# Patient Record
Sex: Male | Born: 1970 | Race: White | Hispanic: No | Marital: Single | Smoking: Former smoker
Health system: Southern US, Community
[De-identification: ages and names within clinical notes are randomized; demographics above are authoritative.]

## PROBLEM LIST (undated history)

## (undated) DIAGNOSIS — E785 Hyperlipidemia, unspecified: Secondary | ICD-10-CM

## (undated) DIAGNOSIS — M1990 Unspecified osteoarthritis, unspecified site: Secondary | ICD-10-CM

## (undated) DIAGNOSIS — K219 Gastro-esophageal reflux disease without esophagitis: Secondary | ICD-10-CM

## (undated) HISTORY — PX: APPENDECTOMY: SHX54

## (undated) HISTORY — PX: MULTIPLE TOOTH EXTRACTIONS: SHX2053

## (undated) HISTORY — PX: VASECTOMY: SHX75

---

## 2006-03-31 ENCOUNTER — Emergency Department: Payer: Self-pay | Admitting: Emergency Medicine

## 2008-04-20 IMAGING — CR DG ANKLE COMPLETE 3+V*L*
1 series · 5 of 5 positions shown · non-contrast
Comparison: none

REASON FOR EXAM: fall  [facility]
COMMENTS:

PROCEDURE:     DXR - DXR ANKLE LEFT COMPLETE  - March 31, 2006  [DATE]
RESULT:     Multiple views reveal no acute fractures.  There appears old
chip fractures from the medial malleolus.  Soft tissue swelling over the
lateral malleolus is noted.

[Series 1: view not recorded · 0.17mm/px · 5 of 5 slices shown]
[im 1/5]
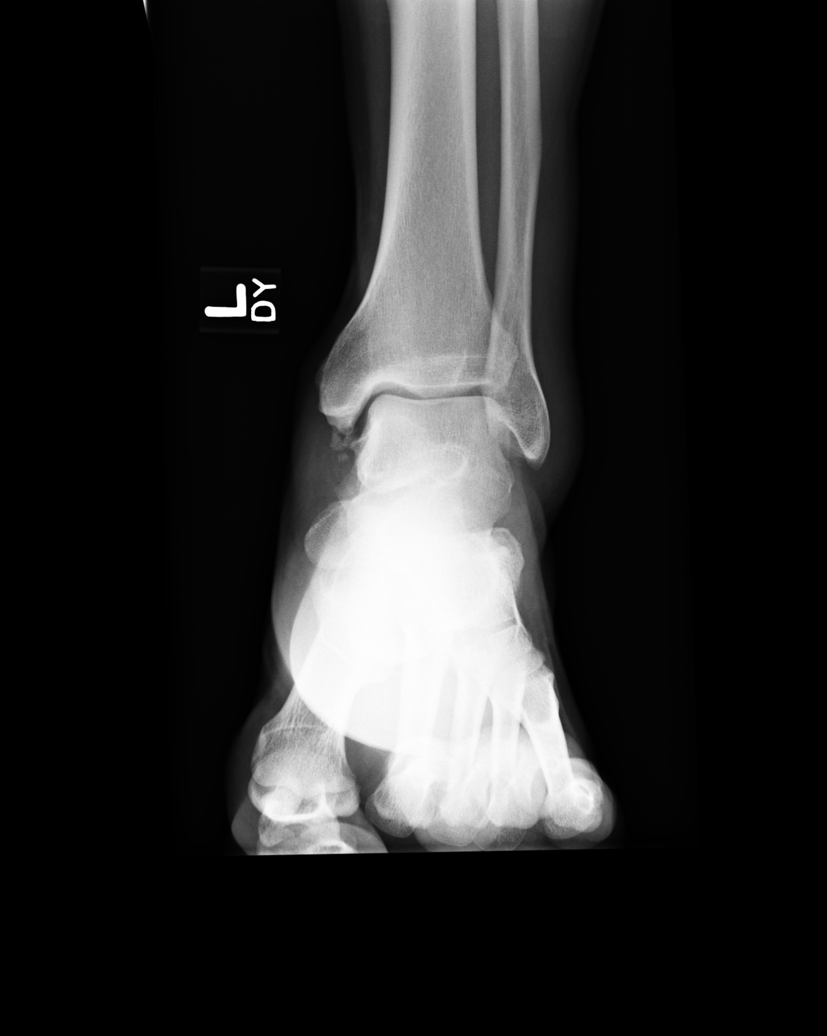
[im 2/5]
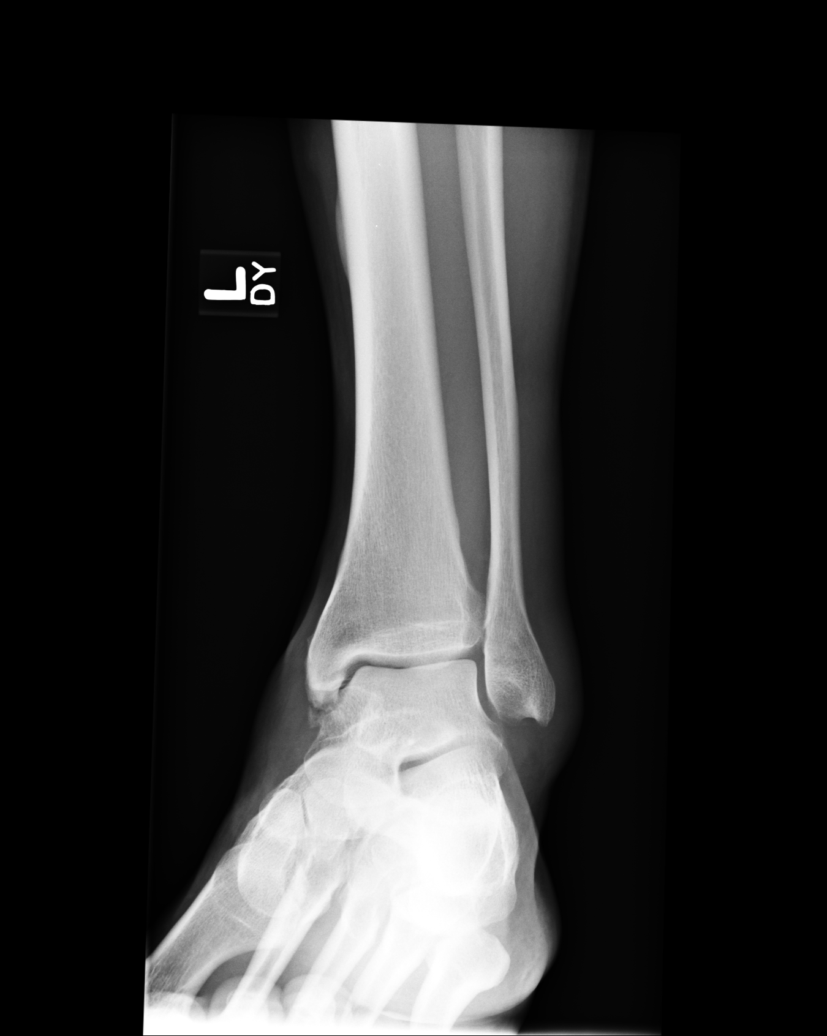
[im 3/5]
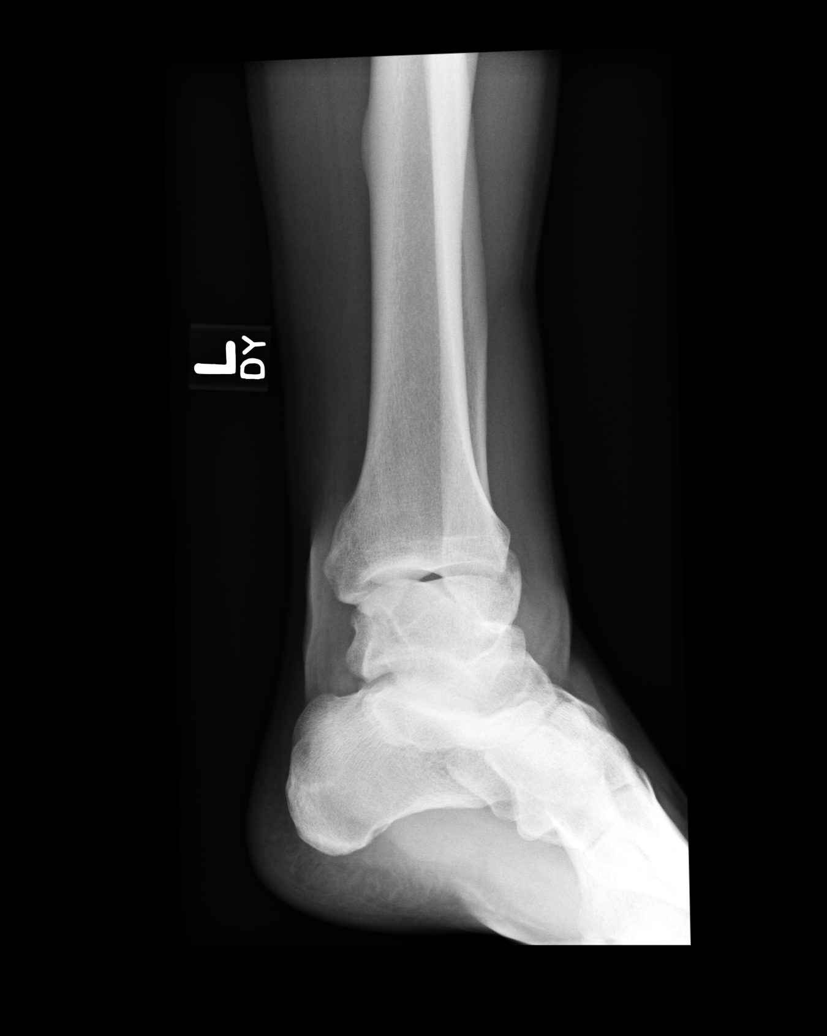
[im 4/5]
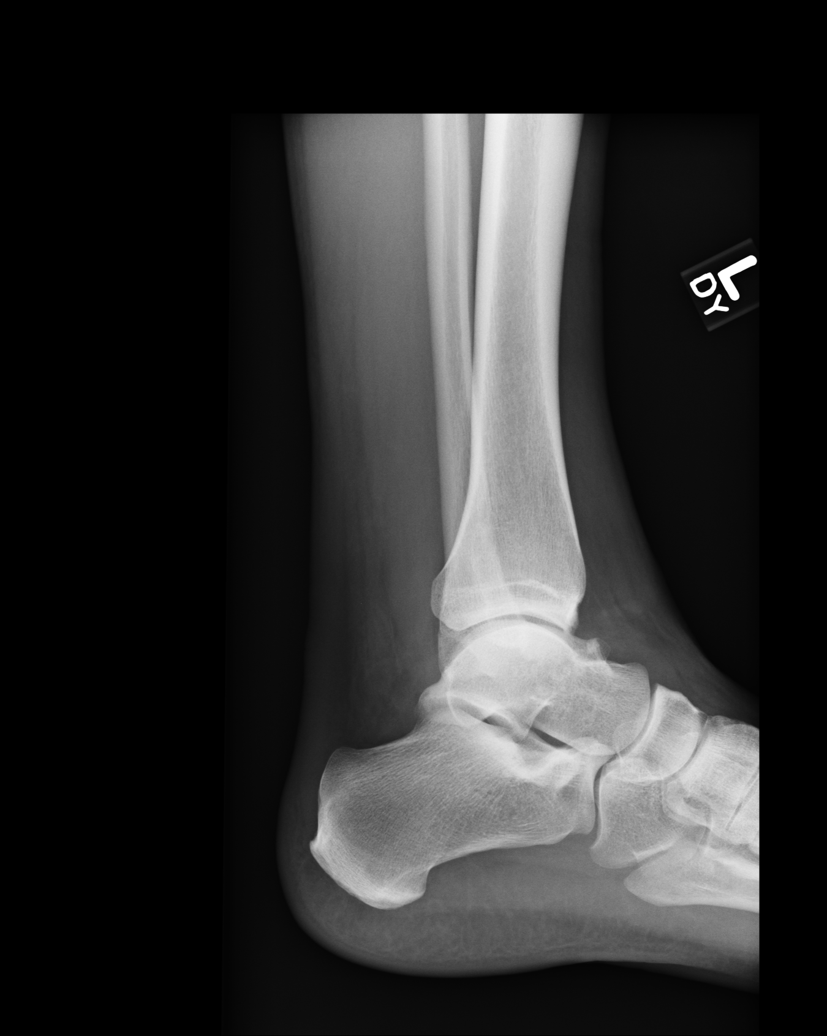
[im 5/5]
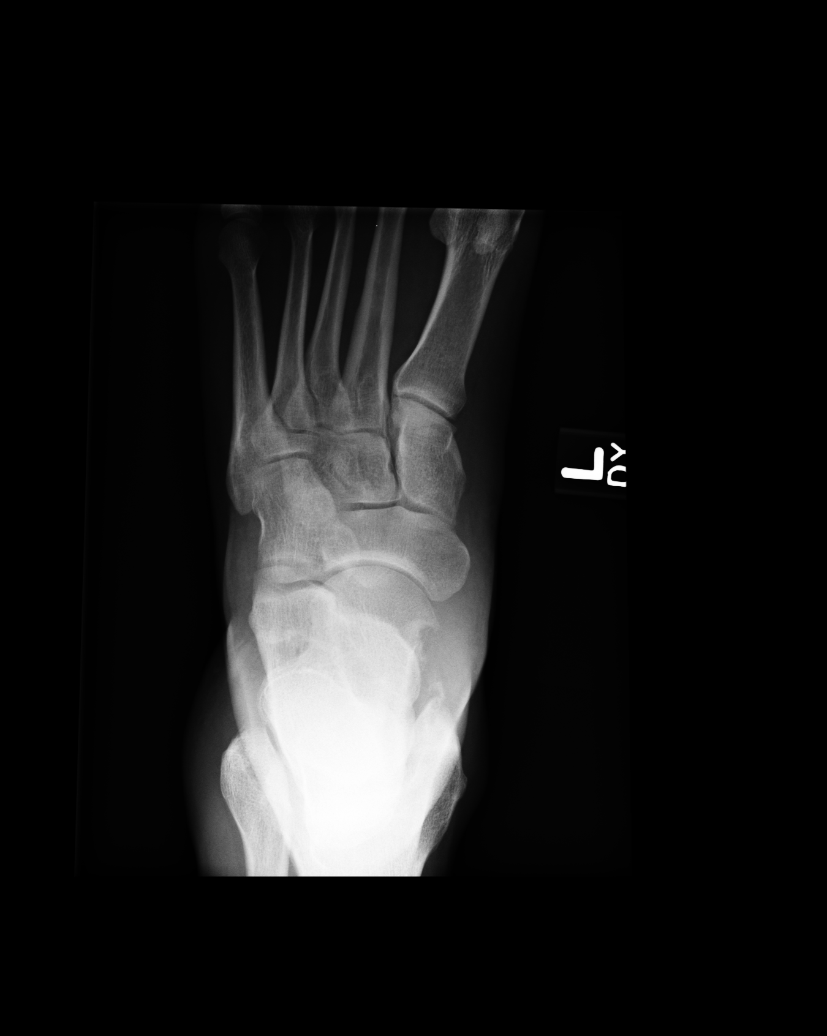

[5 of 5 positions shown; findings below may reference images not displayed]

IMPRESSION: No acute fractures seen.  Old chip fractures over the
lateral malleolus.

## 2016-03-05 ENCOUNTER — Encounter: Payer: Self-pay | Admitting: Gynecology

## 2016-03-05 ENCOUNTER — Ambulatory Visit
Admission: EM | Admit: 2016-03-05 | Discharge: 2016-03-05 | Disposition: A | Discharge status: Home / Self Care | Payer: 59 | Attending: Family Medicine | Admitting: Family Medicine

## 2016-03-05 DIAGNOSIS — S0501XA Injury of conjunctiva and corneal abrasion without foreign body, right eye, initial encounter: Secondary | ICD-10-CM

## 2016-03-05 HISTORY — DX: Hyperlipidemia, unspecified: E78.5

## 2016-03-05 HISTORY — DX: Unspecified osteoarthritis, unspecified site: M19.90

## 2016-03-05 HISTORY — DX: Gastro-esophageal reflux disease without esophagitis: K21.9

## 2016-03-05 MED ORDER — TETRACAINE HCL 0.5 % OP SOLN: 2.0000 [drp] | Freq: Once | OPHTHALMIC | Status: DC

## 2016-03-05 MED ORDER — ERYTHROMYCIN 5 MG/GM OP OINT: 1.0000 "application " | TOPICAL_OINTMENT | Freq: Four times a day (QID) | OPHTHALMIC | 0 refills | Status: DC

## 2016-03-05 MED ORDER — FLUORESCEIN SODIUM 1 MG OP STRP: 1.0000 | ORAL_STRIP | Freq: Once | OPHTHALMIC | Status: DC

## 2016-03-05 NOTE — Discharge Instructions (Signed)
Use medication as prescribed. Avoid rubbing.  Follow up with ophthalmology this week.   Follow up with your primary care physician this week as needed. Return to Urgent care for new or worsening concerns.

## 2016-03-05 NOTE — ED Provider Notes (Signed)
[facility] ____________________________________________  Time seen: Approximately 9:59 AM  I have reviewed the triage vital signs and the nursing notes.   HISTORY  Chief Complaint Foreign Body in Eye   HPI Nathan Shelton is a 45 y.o. male presents with complaint of right eye irritation and a sensation of foreign body. Patient reports this occurred yesterday afternoon. Patient states he was carrying a load of laundry that he had just gotten out of the dryer, and reports as he dropped the clothes down on the bed the wind from dropping the clothes went back towards his face and he felt like something flew into his eye. Patient states he believes it may have been lint. Patient states he then tried to go remove object from his eye, though never visualized for an object, and expressed concern that he may have scratched his eye. Reports occasionally wears reading glasses, no contact use. Denies any other injury. States right eye feels irritated with clear drainage. Denies any swelling, light sensitivity, vision changes, other discomfort. Denies pain, but states irritated.   Denies fall. Denies other injury. Denies chemical exposure, but her conjunctivitis exposure, welding or grinding metals or other objects. Denies any recent sickness. Denies any other complaints. Denies runny nose, nasal congestion, cough, sore throat, headache, neck pain or other complaints.    Past Medical History:  Diagnosis Date  . GERD (gastroesophageal reflux disease)   . Hyperlipemia   . Osteoarthritis    bilateral knee    There are no active problems to display for this patient.   Past Surgical History:  Procedure Laterality Date  . APPENDECTOMY    . MULTIPLE TOOTH EXTRACTIONS    . VASECTOMY         Current Facility-Administered Medications:  .  fluorescein ophthalmic strip 1 strip, 1 strip, Right Eye, Once, Nathan DillsLindsey Rane Blitch, NP .  tetracaine (PONTOCAINE) 0.5 % ophthalmic solution 2 drop, 2  drop, Right Eye, Once, Nathan DillsLindsey Niccole Witthuhn, NP  Current Outpatient Prescriptions:  .  erythromycin ophthalmic ointment, Place 1 application into the right eye 4 (four) times daily. For seven days, Disp: 3.5 g, Rfl: 0  Allergies Patient has no known allergies.  No family history on file.  Social History Social History  Substance Use Topics  . Smoking status: Former Games developermoker  . Smokeless tobacco: Never Used  . Alcohol use Yes    Review of Systems Constitutional: No fever/chills Eyes: No visual changes.As above.  ENT: No sore throat. Cardiovascular: Denies chest pain. Respiratory: Denies shortness of breath. Gastrointestinal: No abdominal pain.  No nausea, no vomiting.  No diarrhea.  No constipation. Genitourinary: Negative for dysuria. Musculoskeletal: Negative for back pain. Skin: Negative for rash. Neurological: Negative for headaches, focal weakness or numbness.  10-point ROS otherwise negative.  ____________________________________________   PHYSICAL EXAM:  VITAL SIGNS: ED Triage Vitals  Enc Vitals Group     BP 03/05/16 0929 110/74     Pulse Rate 03/05/16 0929 70     Resp 03/05/16 0929 16     Temp 03/05/16 0929 98.1 F (36.7 C)     Temp Source 03/05/16 0929 Oral     SpO2 03/05/16 0929 100 %     Weight 03/05/16 0931 170 lb (77.1 kg)     Height 03/05/16 0931 5\' 10"  (1.778 m)     Head Circumference --      Peak Flow --      Pain Score 03/05/16 0934 1     Pain Loc --  Pain Edu? --      Excl. in GC? --      Visual Acuity  Right Eye Distance: 20/25 Left Eye Distance: 20/25 (without corrective lens) Bilateral Distance: 20/20 (eithout corrective lens)   Constitutional: Alert and oriented. Well appearing and in no acute distress. Eyes: Mild right conjunctival injection, no foreign bodies visualized, no exudate. Left conjunctiva normal, no foreign bodies visualized, no exudate. PERRL. EOMI.No pain with EOMs. Ears seen exam revealing small corneal abrasion at  approximately 1 o'clock, no foreign body visualized.  ENT      Head: Normocephalic and atraumatic.      Ears : No erythema, normal TMs bilaterally.       Nose: No congestion/rhinnorhea.      Mouth/Throat: Mucous membranes are moist.Oropharynx non-erythematous. Neck: No stridor. Supple without meningismus.  Hematological/Lymphatic/Immunilogical: No cervical lymphadenopathy. Cardiovascular: Normal rate, regular rhythm. Grossly normal heart sounds.  Good peripheral circulation. Respiratory: Normal respiratory effort without tachypnea nor retractions. Breath sounds are clear and equal bilaterally. No wheezes/rales/rhonchi.. Musculoskeletal: Ambulatory with steady gait.  Neurologic:  Normal speech and language. No gross focal neurologic deficits are appreciated. Speech is normal. No gait instability.  Skin:  Skin is warm, dry and intact. No rash noted. Psychiatric: Mood and affect are normal. Speech and behavior are normal. Patient exhibits appropriate insight and judgment   ___________________________________________   LABS (all labs ordered are listed, but only abnormal results are displayed)  Labs Reviewed - No data to display ____________________________________________   PROCEDURES Procedures   Eye exam Procedure explained and verbal consent obtained.  Anesthesia: tetracaine ophthalmic 2 drops Right eye examined with fluorescein strip.  No foreign bodies visualized. Corneal abrasion noted at 1 o'clock.  Patient tolerated well.    INITIAL IMPRESSION / ASSESSMENT AND PLAN / ED COURSE  Pertinent labs & imaging results that were available during my care of the patient were reviewed by me and considered in my medical decision making (see chart for details).  Very well-appearing patient. No acute distress. Presenting for the complaints of right eye irritation since yesterday. No foreign bodies visualized. Corneal abrasion noted. Will treat patient with supportive care and  erythromycin ophthalmic ointment. Encouraged patient to follow-up with ophthalmology this week.  Discussed follow up with Primary care physician this week. Discussed follow up and return parameters including no resolution or any worsening concerns. Patient verbalized understanding and agreed to plan.   ____________________________________________   FINAL CLINICAL IMPRESSION(S) / ED DIAGNOSES  Final diagnoses:  Abrasion of right cornea, initial encounter     Discharge Medication List as of 03/05/2016 10:23 AM    START taking these medications   Details  erythromycin ophthalmic ointment Place 1 application into the right eye 4 (four) times daily. For seven days, Starting Sun 03/05/2016, Normal        Note: This dictation was prepared with Dragon dictation along with smaller phrase technology. Any transcriptional errors that result from this process are unintentional.    Clinical Course       Nathan DillsLindsey Johndavid Geralds, NP 03/05/16 1316

## 2016-03-05 NOTE — ED Triage Notes (Signed)
Per patient was taking clothes out of dryer and something got into his right eye.

## 2017-02-02 DIAGNOSIS — L218 Other seborrheic dermatitis: Secondary | ICD-10-CM | POA: Diagnosis not present

## 2017-02-19 ENCOUNTER — Ambulatory Visit
Admission: EM | Admit: 2017-02-19 | Discharge: 2017-02-19 | Disposition: A | Discharge status: Home / Self Care | Payer: 59 | Attending: Emergency Medicine | Admitting: Emergency Medicine

## 2017-02-19 DIAGNOSIS — J0100 Acute maxillary sinusitis, unspecified: Secondary | ICD-10-CM | POA: Diagnosis not present

## 2017-02-19 DIAGNOSIS — J029 Acute pharyngitis, unspecified: Secondary | ICD-10-CM

## 2017-02-19 MED ORDER — LIDOCAINE VISCOUS 2 % MT SOLN: 15.0000 mL | Freq: Three times a day (TID) | OROMUCOSAL | 0 refills | Status: DC | PRN

## 2017-02-19 MED ORDER — AMOXICILLIN-POT CLAVULANATE 875-125 MG PO TABS: 1.0000 | ORAL_TABLET | Freq: Two times a day (BID) | ORAL | 0 refills | Status: DC

## 2017-02-19 NOTE — ED Provider Notes (Signed)
[facility] ____________________________________________  Time seen: Approximately 8:21 AM  I have reviewed the triage vital signs and the nursing notes.   HISTORY  Chief Complaint Sinusitis   HPI Nathan Shelton is a 46 y.o. male presenting for evaluation of 5-6 days runny nose, nasal congestion, sinus pressure, sore throat that has been constant and gradually worsened to increased sinus pressure and drainage. States sore throat has remain constant. States some nasal congestion prior. States sore throat mostly at night and in morning with associated post nasal drainage. States sore throat is mild to moderate this time. States has been taking some over-the-counter cough and congestion combination agents without relief. Denies fevers. Denies insect contacts. States overall continues to eat and drink well. Denies chest pain, shortness of breath, abdominal pain, extremity pain, extremity swelling or rash. Denies recent sickness. Denies recent antibiotic use.      Past Medical History:  Diagnosis Date  . GERD (gastroesophageal reflux disease)   . Hyperlipemia   . Osteoarthritis    bilateral knee    There are no active problems to display for this patient.   Past Surgical History:  Procedure Laterality Date  . APPENDECTOMY    . MULTIPLE TOOTH EXTRACTIONS    . VASECTOMY       No current facility-administered medications for this encounter.   Current Outpatient Prescriptions:  .  amoxicillin-clavulanate (AUGMENTIN) 875-125 MG tablet, Take 1 tablet by mouth every 12 (twelve) hours., Disp: 20 tablet, Rfl: 0 .  erythromycin ophthalmic ointment, Place 1 application into the right eye 4 (four) times daily. For seven days, Disp: 3.5 g, Rfl: 0 .  lidocaine (XYLOCAINE) 2 % solution, Use as directed 15 mLs in the mouth or throat every 8 (eight) hours as needed (sore throat. gargle and spit as needed for sore throat.)., Disp: 85 mL, Rfl: 0  Allergies Patient has no known  allergies.  History reviewed. No pertinent family history.  Social History Social History  Substance Use Topics  . Smoking status: Former Games developermoker  . Smokeless tobacco: Never Used  . Alcohol use Yes     Comment: occasionally    Review of Systems Constitutional: No fever/chills ENT: As above.  Cardiovascular: Denies chest pain. Respiratory: Denies shortness of breath. Gastrointestinal: No abdominal pain.   Musculoskeletal: Negative for back pain. Skin: Negative for rash.  ____________________________________________   PHYSICAL EXAM:  VITAL SIGNS: ED Triage Vitals  Enc Vitals Group     BP 02/19/17 0818 127/81     Pulse Rate 02/19/17 0818 83     Resp 02/19/17 0818 18     Temp 02/19/17 0818 98.5 F (36.9 C)     Temp Source 02/19/17 0818 Oral     SpO2 02/19/17 0818 100 %     Weight 02/19/17 0816 173 lb (78.5 kg)     Height 02/19/17 0816 5\' 10"  (1.778 m)     Head Circumference --      Peak Flow --      Pain Score 02/19/17 0816 6     Pain Loc --      Pain Edu? --      Excl. in GC? --     Constitutional: Alert and oriented. Well appearing and in no acute distress. Eyes: Conjunctivae are normal.  Head: Atraumatic.Mild tenderness to palpation bilateral maxillary sinuses, no frontal sinus pressure. No swelling. No erythema.   Ears: no erythema, normal TMs bilaterally.   Nose: nasal congestion with bilateral nasal turbinate erythema and edema.  Mouth/Throat: Mucous membranes are moist. Mild to moderate pharyngeal erythema. No tonsillar swelling or exudate.  Neck: No stridor.  No cervical spine tenderness to palpation. Hematological/Lymphatic/Immunilogical: No cervical lymphadenopathy. Cardiovascular: Normal rate, regular rhythm. Grossly normal heart sounds. Good peripheral circulation. Respiratory: Normal respiratory effort.  No retractions. No wheezes, rales or rhonchi. Good air movement. Musculoskeletal: Steady gait. No cervical, thoracic or lumbar tenderness to  palpation.  Neurologic:  Normal speech and language. No gait instability. Skin:  Skin is warm, dry and intact. No rash noted. Psychiatric: Mood and affect are normal. Speech and behavior are normal.  ___________________________________________   LABS (all labs ordered are listed, but only abnormal results are displayed)  Labs Reviewed - No data to display   PROCEDURES Procedures    INITIAL IMPRESSION / ASSESSMENT AND PLAN / ED COURSE  Pertinent labs & imaging results that were available during my care of the patient were reviewed by me and considered in my medical decision making (see chart for details).  Well-appearing patient. No acute distress. Discussed in detail patient suspect recent viral infection, discussed evaluation of strep swab, patient declined. Concern for secondary sinusitis. Rx given for Augmentin, encouraged to hold for 2 more days prior to initiating. Encouraged over-the-counter supportive care, Sudafed, when necessary lidocaine gargles. Encouraged rest, fluids and supportive care.Discussed indication, risks and benefits of medications with patient.  Discussed follow up with Primary care physician this week. Discussed follow up and return parameters including no resolution or any worsening concerns. Patient verbalized understanding and agreed to plan.   ____________________________________________   FINAL CLINICAL IMPRESSION(S) / ED DIAGNOSES  Final diagnoses:  Acute maxillary sinusitis, recurrence not specified  Acute pharyngitis, unspecified etiology     Discharge Medication List as of 02/19/2017  8:29 AM    START taking these medications   Details  amoxicillin-clavulanate (AUGMENTIN) 875-125 MG tablet Take 1 tablet by mouth every 12 (twelve) hours., Starting Mon 02/19/2017, Normal    lidocaine (XYLOCAINE) 2 % solution Use as directed 15 mLs in the mouth or throat every 8 (eight) hours as needed (sore throat. gargle and spit as needed for sore  throat.)., Starting Mon 02/19/2017, Normal        Note: This dictation was prepared with Dragon dictation along with smaller phrase technology. Any transcriptional errors that result from this process are unintentional.         Renford Dills, NP 02/19/17 [phone]

## 2017-02-19 NOTE — ED Triage Notes (Signed)
Patient complains of sinus pain and pressure with congestion. Patient reports that symptoms started 5 days and have been constant and worsening daily.

## 2017-02-19 NOTE — Discharge Instructions (Signed)
Take medication as prescribed. Rest. Drink plenty of fluids.  ° °Follow up with your primary care physician this week as needed. Return to Urgent care for new or worsening concerns.  ° °

## 2017-03-01 DIAGNOSIS — Z0000 Encounter for general adult medical examination without abnormal findings: Secondary | ICD-10-CM | POA: Diagnosis not present

## 2017-03-01 DIAGNOSIS — G8929 Other chronic pain: Secondary | ICD-10-CM | POA: Diagnosis not present

## 2017-03-01 DIAGNOSIS — M25511 Pain in right shoulder: Secondary | ICD-10-CM | POA: Diagnosis not present

## 2017-03-06 DIAGNOSIS — G8929 Other chronic pain: Secondary | ICD-10-CM | POA: Diagnosis not present

## 2017-03-06 DIAGNOSIS — M7551 Bursitis of right shoulder: Secondary | ICD-10-CM | POA: Diagnosis not present

## 2017-03-06 DIAGNOSIS — M25511 Pain in right shoulder: Secondary | ICD-10-CM | POA: Diagnosis not present

## 2018-03-06 DIAGNOSIS — N183 Chronic kidney disease, stage 3 (moderate): Secondary | ICD-10-CM | POA: Diagnosis not present

## 2018-03-06 DIAGNOSIS — Z0000 Encounter for general adult medical examination without abnormal findings: Secondary | ICD-10-CM | POA: Diagnosis not present

## 2018-03-06 DIAGNOSIS — E782 Mixed hyperlipidemia: Secondary | ICD-10-CM | POA: Diagnosis not present

## 2023-12-24 ENCOUNTER — Other Ambulatory Visit: Payer: Self-pay | Admitting: Surgery

## 2023-12-24 DIAGNOSIS — M7581 Other shoulder lesions, right shoulder: Secondary | ICD-10-CM

## 2023-12-24 DIAGNOSIS — M7521 Bicipital tendinitis, right shoulder: Secondary | ICD-10-CM

## 2023-12-28 ENCOUNTER — Ambulatory Visit
Admission: RE | Admit: 2023-12-28 | Discharge: 2023-12-28 | Disposition: A | Discharge status: Home / Self Care | Source: Ambulatory Visit | Attending: Surgery | Admitting: Surgery

## 2023-12-28 DIAGNOSIS — M7581 Other shoulder lesions, right shoulder: Secondary | ICD-10-CM

## 2023-12-28 DIAGNOSIS — M7521 Bicipital tendinitis, right shoulder: Secondary | ICD-10-CM

## 2024-01-24 ENCOUNTER — Other Ambulatory Visit: Payer: Self-pay | Admitting: Surgery

## 2024-02-05 ENCOUNTER — Other Ambulatory Visit: Payer: Self-pay

## 2024-02-05 ENCOUNTER — Encounter
Admission: RE | Admit: 2024-02-05 | Discharge: 2024-02-05 | Disposition: A | Discharge status: Home / Self Care | Source: Ambulatory Visit | Attending: Surgery | Admitting: Surgery

## 2024-02-05 DIAGNOSIS — Z01818 Encounter for other preprocedural examination: Secondary | ICD-10-CM | POA: Diagnosis present

## 2024-02-05 NOTE — Patient Instructions (Addendum)
 Your procedure is scheduled on: Tuesday 02/12/24 Report to the Registration Desk on the 1st floor of the [facility]. To find out your arrival time, please call [phone] between 1PM - 3PM on: Monday 02/11/24 If your arrival time is 6:00 am, do not arrive before that time as the [facility] entrance doors do not open until 6:00 am.  REMEMBER: Instructions that are not followed completely may result in serious medical risk, up to and including death; or upon the discretion of your surgeon and anesthesiologist your surgery may need to be rescheduled.  Do not eat food after midnight the night before surgery.  No gum chewing or hard candies.  You may however, drink CLEAR liquids up to 2 hours before you are scheduled to arrive for your surgery. Do not drink anything within 2 hours of your scheduled arrival time.  Clear liquids include: - water  - apple juice without pulp - gatorade (not RED colors) - black coffee or tea (Do NOT add milk or creamers to the coffee or tea) Do NOT drink anything that is not on this list.  **Type 1 and Type 2 diabetics should only drink water.**  In addition, your doctor has ordered for you to drink the provided:  Ensure Pre-Surgery Clear Carbohydrate Drink  Drinking this carbohydrate drink up to two hours before surgery helps to reduce insulin resistance and improve patient outcomes. Please complete drinking 2 hours before scheduled arrival time.  One week prior to surgery: Stop Anti-inflammatories (NSAIDS) such as Advil, Aleve, Ibuprofen, Motrin, Naproxen, Naprosyn and Aspirin based products such as Excedrin, Goody's Powder, BC Powder.  You may however, continue to take Tylenol if needed for pain up until the day of surgery.  Stop ANY OVER THE COUNTER supplements and vitamins until after surgery.  Continue taking all of your other prescription medications up until the day of surgery. (Take Omeprazole daily)  ON THE DAY OF SURGERY ONLY TAKE THESE  MEDICATIONS WITH SIPS OF WATER:  Omeprazole  No Alcohol for 24 hours before or after surgery.  No Smoking including e-cigarettes for 24 hours before surgery.  No chewable tobacco products for at least 6 hours before surgery.  No nicotine patches on the day of surgery.  Do not use any recreational drugs for at least a week (preferably 2 weeks) before your surgery.  Please be advised that the combination of cocaine and anesthesia may have negative outcomes, up to and including death. If you test positive for cocaine, your surgery will be cancelled.  On the morning of surgery brush your teeth with toothpaste and water, you may rinse your mouth with mouthwash if you wish. Do not swallow any toothpaste or mouthwash.  Use CHG Soap or wipes as directed on instruction sheet.  Do not shave body hair from the neck down 48 hours before surgery.  Do not wear lotions, powders, deodorant, cologne or perfumes  Do not wear jewelry, make-up, hairpins, clips or nail polish.  For welded (permanent) jewelry: bracelets, anklets, waist bands, etc.  Please have this removed prior to surgery.  If it is not removed, there is a chance that hospital personnel will need to cut it off on the day of surgery.  Contact lenses, hearing aids and dentures may not be worn into surgery.  Do not bring valuables to the hospital. [facility] is not responsible for any missing/lost belongings or valuables.   Notify your doctor if there is any change in your medical condition (cold, fever, infection).  Wear comfortable clothing (specific to your surgery type) to the hospital.  After surgery, you can help prevent lung complications by doing breathing exercises.  Take deep breaths and cough every 1-2 hours. Your doctor may order a device called an Incentive Spirometer to help you take deep breaths.  If you are being discharged the day of surgery, you will not be allowed to drive home. You will need a responsible  individual to drive you home and stay with you for 24 hours after surgery.   Please call the Pre-admissions Testing Dept. at [phone] if you have any questions about these instructions.  Surgery Visitation Policy:  Patients having surgery or a procedure may have two visitors.  Children under the age of 60 must have an adult with them who is not the patient.   Merchandiser, retail to address health-related social needs:  [email or web address]                                                                                                             Preparing for Surgery with CHLORHEXIDINE GLUCONATE (CHG) Soap  Chlorhexidine Gluconate (CHG) Soap  o An antiseptic cleaner that kills germs and bonds with the skin to continue killing germs even after washing  o Used for showering the night before surgery and morning of surgery  Before surgery, you can play an important role by reducing the number of germs on your skin.  CHG (Chlorhexidine gluconate) soap is an antiseptic cleanser which kills germs and bonds with the skin to continue killing germs even after washing.  Please do not use if you have an allergy to CHG or antibacterial soaps. If your skin becomes reddened/irritated stop using the CHG.  1. Shower the NIGHT BEFORE SURGERY with CHG soap.  2. If you choose to wash your hair, wash your hair first as usual with your normal shampoo.  3. After shampooing, rinse your hair and body thoroughly to remove the shampoo.  4. Use CHG as you would any other liquid soap. You can apply CHG directly to the skin and wash gently with a clean washcloth.  5. Apply the CHG soap to your body only from the neck down. Do not use on open wounds or open sores. Avoid contact with your eyes, ears, mouth, and genitals (private parts). Wash face and genitals (private parts) with your normal soap.  6. Wash thoroughly, paying special attention to the area where your surgery will be  performed.  7. Thoroughly rinse your body with warm water.  8. Do not shower/wash with your normal soap after using and rinsing off the CHG soap.  9. Do not use lotions, oils, etc., after showering with CHG.  10. Pat yourself dry with a clean towel.  11. Wear clean pajamas to bed the night before surgery.  12. Place clean sheets on your bed the night of your shower and do not sleep with pets.  13. Do not apply any deodorants/lotions/powders.  14. Please wear clean clothes to the hospital.  15. Remember to brush your  teeth with your regular toothpaste.  How to Use an Incentive Spirometer  An incentive spirometer is a tool that measures how well you are filling your lungs with each breath. Learning to take long, deep breaths using this tool can help you keep your lungs clear and active. This may help to reverse or lessen your chance of developing breathing (pulmonary) problems, especially infection. You may be asked to use a spirometer: After a surgery. If you have a lung problem or a history of smoking. After a long period of time when you have been unable to move or be active. If the spirometer includes an indicator to show the highest number that you have reached, your health care provider or respiratory therapist will help you set a goal. Keep a log of your progress as told by your health care provider. What are the risks? Breathing too quickly may cause dizziness or cause you to pass out. Take your time so you do not get dizzy or light-headed. If you are in pain, you may need to take pain medicine before doing incentive spirometry. It is harder to take a deep breath if you are having pain. How to use your incentive spirometer  Sit up on the edge of your bed or on a chair. Hold the incentive spirometer so that it is in an upright position. Before you use the spirometer, breathe out normally. Place the mouthpiece in your mouth. Make sure your lips are closed tightly around  it. Breathe in slowly and as deeply as you can through your mouth, causing the piston or the ball to rise toward the top of the chamber. Hold your breath for 3-5 seconds, or for as long as possible. If the spirometer includes a coach indicator, use this to guide you in breathing. Slow down your breathing if the indicator goes above the marked areas. Remove the mouthpiece from your mouth and breathe out normally. The piston or ball will return to the bottom of the chamber. Rest for a few seconds, then repeat the steps 10 or more times. Take your time and take a few normal breaths between deep breaths so that you do not get dizzy or light-headed. Do this every 1-2 hours when you are awake. If the spirometer includes a goal marker to show the highest number you have reached (best effort), use this as a goal to work toward during each repetition. After each set of 10 deep breaths, cough a few times. This will help to make sure that your lungs are clear. If you have an incision on your chest or abdomen from surgery, place a pillow or a rolled-up towel firmly against the incision when you cough. This can help to reduce pain while taking deep breaths and coughing. General tips When you are able to get out of bed: Walk around often. Continue to take deep breaths and cough in order to clear your lungs. Keep using the incentive spirometer until your health care provider says it is okay to stop using it. If you have been in the hospital, you may be told to keep using the spirometer at home. Contact a health care provider if: You are having difficulty using the spirometer. You have trouble using the spirometer as often as instructed. Your pain medicine is not giving enough relief for you to use the spirometer as told. You have a fever. Get help right away if: You develop shortness of breath. You develop a cough with bloody mucus from the lungs. You have  fluid or blood coming from an incision site after  you cough. Summary An incentive spirometer is a tool that can help you learn to take long, deep breaths to keep your lungs clear and active. You may be asked to use a spirometer after a surgery, if you have a lung problem or a history of smoking, or if you have been inactive for a long period of time. Use your incentive spirometer as instructed every 1-2 hours while you are awake. If you have an incision on your chest or abdomen, place a pillow or a rolled-up towel firmly against your incision when you cough. This will help to reduce pain. Get help right away if you have shortness of breath, you cough up bloody mucus, or blood comes from your incision when you cough. This information is not intended to replace advice given to you by your health care provider. Make sure you discuss any questions you have with your health care provider. Document Revised: 07/07/2019 Document Reviewed: 07/07/2019 Elsevier Patient Education  2023 [employer].

## 2024-02-05 NOTE — Pre-Procedure Instructions (Signed)
 Attempt x2 to reach pt. Goes straight to voicemail. Msg left x1

## 2024-02-12 ENCOUNTER — Encounter
Admission: RE | Disposition: A | Discharge status: Home / Self Care | Payer: Self-pay | Source: Home / Self Care | Attending: Surgery

## 2024-02-12 ENCOUNTER — Ambulatory Visit: Admitting: Anesthesiology

## 2024-02-12 ENCOUNTER — Ambulatory Visit

## 2024-02-12 ENCOUNTER — Encounter: Payer: Self-pay | Admitting: Surgery

## 2024-02-12 ENCOUNTER — Ambulatory Visit: Admission: RE | Admit: 2024-02-12 | Discharge: 2024-02-12 | Disposition: A | Attending: Surgery | Admitting: Surgery

## 2024-02-12 ENCOUNTER — Other Ambulatory Visit: Payer: Self-pay

## 2024-02-12 DIAGNOSIS — Z79899 Other long term (current) drug therapy: Secondary | ICD-10-CM | POA: Diagnosis not present

## 2024-02-12 DIAGNOSIS — M75101 Unspecified rotator cuff tear or rupture of right shoulder, not specified as traumatic: Secondary | ICD-10-CM | POA: Insufficient documentation

## 2024-02-12 DIAGNOSIS — M19011 Primary osteoarthritis, right shoulder: Secondary | ICD-10-CM | POA: Insufficient documentation

## 2024-02-12 DIAGNOSIS — Z87891 Personal history of nicotine dependence: Secondary | ICD-10-CM | POA: Diagnosis not present

## 2024-02-12 DIAGNOSIS — K219 Gastro-esophageal reflux disease without esophagitis: Secondary | ICD-10-CM | POA: Diagnosis not present

## 2024-02-12 DIAGNOSIS — M67911 Unspecified disorder of synovium and tendon, right shoulder: Secondary | ICD-10-CM | POA: Insufficient documentation

## 2024-02-12 DIAGNOSIS — M7521 Bicipital tendinitis, right shoulder: Secondary | ICD-10-CM | POA: Diagnosis present

## 2024-02-12 DIAGNOSIS — M25811 Other specified joint disorders, right shoulder: Secondary | ICD-10-CM | POA: Diagnosis not present

## 2024-02-12 DIAGNOSIS — M7581 Other shoulder lesions, right shoulder: Secondary | ICD-10-CM | POA: Diagnosis present

## 2024-02-12 HISTORY — PX: SUBACROMIAL DECOMPRESSION: SHX5174

## 2024-02-12 HISTORY — PX: RESECTION DISTAL CLAVICAL: SHX5053

## 2024-02-12 HISTORY — PX: POSTERIOR LUMBAR FUSION 2 WITH HARDWARE REMOVAL: SHX7297

## 2024-02-12 HISTORY — PX: BICEPT TENODESIS: SHX5116

## 2024-02-12 SURGERY — ARTHROSCOPY, SHOULDER WITH DEBRIDEMENT
Anesthesia: General | Site: Shoulder | Laterality: Right

## 2024-02-12 MED ORDER — MIDAZOLAM HCL 2 MG/2ML IJ SOLN
1.0000 mg | INTRAMUSCULAR | Status: AC | PRN
  Administered 2024-02-12 (×2): 1 mg via INTRAVENOUS

## 2024-02-12 MED ORDER — SODIUM CHLORIDE 0.9 % IV SOLN
150.0000 mg | Freq: Once | INTRAVENOUS | Status: AC
  Administered 2024-02-12: 150 mg via INTRAVENOUS
  Filled 2024-02-12: qty 5

## 2024-02-12 MED ORDER — BUPIVACAINE-EPINEPHRINE (PF) 0.5% -1:200000 IJ SOLN
INTRAMUSCULAR | Status: AC
  Filled 2024-02-12: qty 30

## 2024-02-12 MED ORDER — LIDOCAINE HCL (PF) 1 % IJ SOLN
INTRAMUSCULAR | Status: AC
  Filled 2024-02-12: qty 5

## 2024-02-12 MED ORDER — FENTANYL CITRATE (PF) 100 MCG/2ML IJ SOLN
INTRAMUSCULAR | Status: AC
  Filled 2024-02-12: qty 2

## 2024-02-12 MED ORDER — DROPERIDOL 2.5 MG/ML IJ SOLN
INTRAMUSCULAR | Status: AC
  Filled 2024-02-12: qty 2

## 2024-02-12 MED ORDER — OXYCODONE HCL 5 MG PO TABS
5.0000 mg | ORAL_TABLET | ORAL | Status: DC | PRN
  Administered 2024-02-12: 5 mg via ORAL

## 2024-02-12 MED ORDER — RINGERS IRRIGATION IR SOLN
Status: DC | PRN
  Administered 2024-02-12 (×2): 3000 mL
  Administered 2024-02-12: 2000 mL

## 2024-02-12 MED ORDER — FENTANYL CITRATE (PF) 100 MCG/2ML IJ SOLN
25.0000 ug | INTRAMUSCULAR | Status: DC | PRN
  Administered 2024-02-12 (×2): 50 ug via INTRAVENOUS

## 2024-02-12 MED ORDER — CHLORHEXIDINE GLUCONATE 0.12 % MT SOLN
OROMUCOSAL | Status: AC
  Filled 2024-02-12: qty 15

## 2024-02-12 MED ORDER — ROCURONIUM BROMIDE 100 MG/10ML IV SOLN
INTRAVENOUS | Status: DC | PRN
  Administered 2024-02-12: 20 mg via INTRAVENOUS
  Administered 2024-02-12: 60 mg via INTRAVENOUS

## 2024-02-12 MED ORDER — PROPOFOL 1000 MG/100ML IV EMUL
INTRAVENOUS | Status: AC
  Filled 2024-02-12: qty 100

## 2024-02-12 MED ORDER — ONDANSETRON HCL 4 MG/2ML IJ SOLN: 4.0000 mg | Freq: Four times a day (QID) | INTRAMUSCULAR | Status: DC | PRN

## 2024-02-12 MED ORDER — ROCURONIUM BROMIDE 10 MG/ML (PF) SYRINGE
PREFILLED_SYRINGE | INTRAVENOUS | Status: AC
Start: 2024-02-12 — End: 2024-02-12
  Filled 2024-02-12: qty 10

## 2024-02-12 MED ORDER — METOCLOPRAMIDE HCL 5 MG/ML IJ SOLN: 5.0000 mg | Freq: Three times a day (TID) | INTRAMUSCULAR | Status: DC | PRN

## 2024-02-12 MED ORDER — ONDANSETRON HCL 4 MG/2ML IJ SOLN
INTRAMUSCULAR | Status: DC | PRN
  Administered 2024-02-12: 4 mg via INTRAVENOUS

## 2024-02-12 MED ORDER — OXYCODONE HCL 5 MG PO TABS
ORAL_TABLET | ORAL | Status: AC
  Filled 2024-02-12: qty 1

## 2024-02-12 MED ORDER — EPINEPHRINE PF 1 MG/ML IJ SOLN
INTRAMUSCULAR | Status: AC
  Filled 2024-02-12: qty 1

## 2024-02-12 MED ORDER — ACETAMINOPHEN 325 MG PO TABS
325.0000 mg | ORAL_TABLET | Freq: Four times a day (QID) | ORAL | Status: DC | PRN
  Administered 2024-02-12: 650 mg via ORAL

## 2024-02-12 MED ORDER — ORAL CARE MOUTH RINSE: 15.0000 mL | Freq: Once | OROMUCOSAL | Status: AC

## 2024-02-12 MED ORDER — ACETAMINOPHEN 325 MG PO TABS
ORAL_TABLET | ORAL | Status: AC
  Filled 2024-02-12: qty 2

## 2024-02-12 MED ORDER — MIDAZOLAM HCL 2 MG/2ML IJ SOLN
INTRAMUSCULAR | Status: AC
  Filled 2024-02-12: qty 2

## 2024-02-12 MED ORDER — CHLORHEXIDINE GLUCONATE 0.12 % MT SOLN
15.0000 mL | Freq: Once | OROMUCOSAL | Status: AC
  Administered 2024-02-12: 15 mL via OROMUCOSAL

## 2024-02-12 MED ORDER — OXYCODONE HCL 5 MG PO TABS
5.0000 mg | ORAL_TABLET | Freq: Once | ORAL | Status: AC | PRN
  Administered 2024-02-12: 5 mg via ORAL

## 2024-02-12 MED ORDER — DROPERIDOL 2.5 MG/ML IJ SOLN
0.6250 mg | Freq: Once | INTRAMUSCULAR | Status: AC | PRN
  Administered 2024-02-12: 0.625 mg via INTRAVENOUS

## 2024-02-12 MED ORDER — BUPIVACAINE LIPOSOME 1.3 % IJ SUSP
INTRAMUSCULAR | Status: DC | PRN
  Administered 2024-02-12: 20 mL via PERINEURAL

## 2024-02-12 MED ORDER — LIDOCAINE HCL (PF) 1 % IJ SOLN
INTRAMUSCULAR | Status: DC | PRN
  Administered 2024-02-12: 3 mL via SUBCUTANEOUS

## 2024-02-12 MED ORDER — BUPIVACAINE-EPINEPHRINE (PF) 0.5% -1:200000 IJ SOLN
INTRAMUSCULAR | Status: DC | PRN
  Administered 2024-02-12: 30 mL

## 2024-02-12 MED ORDER — DEXAMETHASONE SOD PHOSPHATE PF 10 MG/ML IJ SOLN
INTRAMUSCULAR | Status: DC | PRN
Start: 2024-02-12 — End: 2024-02-12
  Administered 2024-02-12: 10 mg via INTRAVENOUS

## 2024-02-12 MED ORDER — METOCLOPRAMIDE HCL 10 MG PO TABS: 5.0000 mg | ORAL_TABLET | Freq: Three times a day (TID) | ORAL | Status: DC | PRN

## 2024-02-12 MED ORDER — LACTATED RINGERS IV SOLN: INTRAVENOUS | Status: DC

## 2024-02-12 MED ORDER — SODIUM CHLORIDE 0.9 % IV SOLN: INTRAVENOUS | Status: DC

## 2024-02-12 MED ORDER — OXYCODONE HCL 5 MG PO TABS: 5.0000 mg | ORAL_TABLET | ORAL | 0 refills | Status: AC | PRN

## 2024-02-12 MED ORDER — SUGAMMADEX SODIUM 200 MG/2ML IV SOLN
INTRAVENOUS | Status: DC | PRN
Start: 2024-02-12 — End: 2024-02-12
  Administered 2024-02-12: 200 mg via INTRAVENOUS

## 2024-02-12 MED ORDER — ONDANSETRON HCL 4 MG PO TABS
4.0000 mg | ORAL_TABLET | Freq: Four times a day (QID) | ORAL | Status: DC | PRN
Start: 2024-02-12 — End: 2024-02-12

## 2024-02-12 MED ORDER — BUPIVACAINE LIPOSOME 1.3 % IJ SUSP
INTRAMUSCULAR | Status: AC
Start: 2024-02-12 — End: 2024-02-12
  Filled 2024-02-12: qty 20

## 2024-02-12 MED ORDER — ONDANSETRON HCL 4 MG/2ML IJ SOLN
INTRAMUSCULAR | Status: AC
  Filled 2024-02-12: qty 2

## 2024-02-12 MED ORDER — BUPIVACAINE HCL (PF) 0.5 % IJ SOLN
INTRAMUSCULAR | Status: DC | PRN
  Administered 2024-02-12: 10 mL via PERINEURAL

## 2024-02-12 MED ORDER — PHENYLEPHRINE 80 MCG/ML (10ML) SYRINGE FOR IV PUSH (FOR BLOOD PRESSURE SUPPORT)
PREFILLED_SYRINGE | INTRAVENOUS | Status: AC
  Filled 2024-02-12: qty 10

## 2024-02-12 MED ORDER — BUPIVACAINE HCL (PF) 0.5 % IJ SOLN
INTRAMUSCULAR | Status: AC
  Filled 2024-02-12: qty 10

## 2024-02-12 MED ORDER — PHENYLEPHRINE 80 MCG/ML (10ML) SYRINGE FOR IV PUSH (FOR BLOOD PRESSURE SUPPORT)
PREFILLED_SYRINGE | INTRAVENOUS | Status: DC | PRN
  Administered 2024-02-12 (×3): 80 ug via INTRAVENOUS

## 2024-02-12 MED ORDER — CEFAZOLIN SODIUM-DEXTROSE 2-4 GM/100ML-% IV SOLN
2.0000 g | INTRAVENOUS | Status: AC
Start: 2024-02-12 — End: 2024-02-12
  Administered 2024-02-12: 2 g via INTRAVENOUS

## 2024-02-12 MED ORDER — OXYCODONE HCL 5 MG/5ML PO SOLN: 5.0000 mg | Freq: Once | ORAL | Status: AC | PRN

## 2024-02-12 MED ORDER — PROPOFOL 10 MG/ML IV BOLUS
INTRAVENOUS | Status: AC
  Filled 2024-02-12: qty 20

## 2024-02-12 MED ORDER — CEFAZOLIN SODIUM-DEXTROSE 2-4 GM/100ML-% IV SOLN
INTRAVENOUS | Status: AC
  Filled 2024-02-12: qty 100

## 2024-02-12 MED ORDER — EPINEPHRINE PF 1 MG/ML IJ SOLN
INTRAMUSCULAR | Status: DC | PRN
Start: 2024-02-12 — End: 2024-02-12
  Administered 2024-02-12: 1 mL via INTRAMUSCULAR

## 2024-02-12 MED ORDER — FENTANYL CITRATE (PF) 100 MCG/2ML IJ SOLN
INTRAMUSCULAR | Status: DC | PRN
  Administered 2024-02-12 (×2): 50 ug via INTRAVENOUS

## 2024-02-12 MED ORDER — LIDOCAINE HCL (CARDIAC) PF 100 MG/5ML IV SOSY
PREFILLED_SYRINGE | INTRAVENOUS | Status: DC | PRN
  Administered 2024-02-12: 60 mg via INTRAVENOUS

## 2024-02-12 MED ORDER — PROPOFOL 10 MG/ML IV BOLUS
INTRAVENOUS | Status: DC | PRN
  Administered 2024-02-12: 200 mg via INTRAVENOUS

## 2024-02-12 MED ORDER — ACETAMINOPHEN 10 MG/ML IV SOLN: 1000.0000 mg | Freq: Once | INTRAVENOUS | Status: DC | PRN

## 2024-02-12 SURGICAL SUPPLY — 50 items
ANCHOR DBL 2.6 SLF-PNCH FIBRTK (Anchor) IMPLANT
ANCHOR HEALICOIL REGEN 5.5 (Anchor) IMPLANT
ANCHOR QFIX 2.8 SUT MINI TAPE (Anchor) IMPLANT
ANCHOR QFIX KTLS 1.8 MINI BLUE (Anchor) IMPLANT
BIT DRILL JUGRKNT W/NDL BIT2.9 (DRILL) IMPLANT
BLADE FULL RADIUS 3.5 (BLADE) ×1 IMPLANT
BUR ACROMIONIZER 4.0 (BURR) ×1 IMPLANT
CHLORAPREP W/TINT 26 (MISCELLANEOUS) ×1 IMPLANT
COVER MAYO STAND STRL (DRAPES) ×1 IMPLANT
DILATOR 5.5 THREADED HEALICOIL (MISCELLANEOUS) IMPLANT
ELECTRODE REM PT RTRN 9FT ADLT (ELECTROSURGICAL) ×1 IMPLANT
GAUZE SPONGE 4X4 12PLY STRL (GAUZE/BANDAGES/DRESSINGS) ×1 IMPLANT
GAUZE XEROFORM 1X8 LF (GAUZE/BANDAGES/DRESSINGS) ×1 IMPLANT
GLOVE BIO SURGEON STRL SZ7.5 (GLOVE) ×2 IMPLANT
GLOVE BIO SURGEON STRL SZ8 (GLOVE) ×2 IMPLANT
GLOVE BIOGEL PI IND STRL 8 (GLOVE) ×1 IMPLANT
GLOVE INDICATOR 8.0 STRL GRN (GLOVE) ×1 IMPLANT
GOWN STRL REUS W/ TWL LRG LVL3 (GOWN DISPOSABLE) ×1 IMPLANT
GOWN STRL REUS W/ TWL XL LVL3 (GOWN DISPOSABLE) ×1 IMPLANT
IV LR IRRIG 3000ML ARTHROMATIC (IV SOLUTION) ×1 IMPLANT
KIT CANNULA 8X76-LX IN CANNULA (CANNULA) ×1 IMPLANT
KIT KNEE FIBERTAK DISP (KITS) IMPLANT
KIT SUTURE 1.8 Q-FIX DISP (KITS) IMPLANT
KIT SUTURE 2.8 Q-FIX DISP (MISCELLANEOUS) IMPLANT
MANIFOLD NEPTUNE II (INSTRUMENTS) ×2 IMPLANT
MASK FACE SPIDER DISP (MASK) ×1 IMPLANT
MAT ABSORB FLUID 56X50 GRAY (MISCELLANEOUS) ×1 IMPLANT
NDL TAPERED W/ NITINOL LOOP (MISCELLANEOUS) IMPLANT
NEEDLE TAPERED W/ NITINOL LOOP (MISCELLANEOUS) IMPLANT
PACK ARTHROSCOPY SHOULDER (MISCELLANEOUS) ×1 IMPLANT
PAD ABD DERMACEA PRESS 5X9 (GAUZE/BANDAGES/DRESSINGS) ×2 IMPLANT
PASSER SUT FIRSTPASS SELF (INSTRUMENTS) IMPLANT
PASSER SUT SHUTTLE 45D CVD LFT (SUTURE) IMPLANT
PENCIL SMOKE EVACUATOR (MISCELLANEOUS) IMPLANT
SLING ARM LARGE (SOFTGOODS) ×1 IMPLANT
SLING ULTRA II LG (MISCELLANEOUS) ×1 IMPLANT
SLING ULTRA II M (MISCELLANEOUS) IMPLANT
SPONGE T-LAP 18X18 ~~LOC~~+RFID (SPONGE) ×1 IMPLANT
STAPLER SKIN PROX 35W (STAPLE) ×1 IMPLANT
STRAP SAFETY 5IN WIDE (MISCELLANEOUS) ×1 IMPLANT
SUT ETHIBOND 0 MO6 C/R (SUTURE) ×1 IMPLANT
SUT ETHIBOND NAB MO 7 #0 18IN (SUTURE) IMPLANT
SUT ULTRABRAID 2 COBRAID 38 (SUTURE) IMPLANT
SUT VIC AB 2-0 CT1 (SUTURE) IMPLANT
SUT VIC AB 2-0 CT1 TAPERPNT 27 (SUTURE) ×2 IMPLANT
TAPE MICROFOAM 4IN (TAPE) ×1 IMPLANT
TRAP FLUID SMOKE EVACUATOR (MISCELLANEOUS) ×1 IMPLANT
TUBE SET DOUBLEFLO INFLOW (TUBING) ×1 IMPLANT
WAND WEREWOLF FLOW 90D (MISCELLANEOUS) ×1 IMPLANT
WATER STERILE IRR 500ML POUR (IV SOLUTION) ×1 IMPLANT

## 2024-02-12 NOTE — Transfer of Care (Signed)
 Immediate Anesthesia Transfer of Care Note  Patient: Nathan Shelton  Procedure(s) Performed: ARTHROSCOPY, SHOULDER WITH DEBRIDEMENT (Right: Shoulder) DECOMPRESSION, SUBACROMIAL SPACE (Right: Shoulder) EXCISION, CLAVICLE, DISTAL, OPEN (Right: Shoulder) TENODESIS, BICEPS (Right: Shoulder)  Patient Location: PACU  Anesthesia Type:General  Level of Consciousness: awake, alert , and drowsy  Airway & Oxygen Therapy: Patient Spontanous Breathing and Patient connected to face mask oxygen  Post-op Assessment: Report given to RN and Post -op Vital signs reviewed and stable  Post vital signs: Reviewed and stable  Last Vitals:  Vitals Value Taken Time  BP 118/78 02/12/24 15:49  Temp 36.1 C 02/12/24 15:49  Pulse 65 02/12/24 15:51  Resp 16 02/12/24 15:51  SpO2 94 % 02/12/24 15:51  Vitals shown include unfiled device data.  Last Pain:  Vitals:   02/12/24 1239  TempSrc:   PainSc: 0-No pain         Complications: No notable events documented.

## 2024-02-12 NOTE — Anesthesia Procedure Notes (Signed)
 Anesthesia Regional Block: Interscalene brachial plexus block   Pre-Anesthetic Checklist: , timeout performed,  Correct Patient, Correct Site, Correct Laterality,  Correct Procedure, Correct Position, site marked,  Risks and benefits discussed,  Surgical consent,  Pre-op evaluation,  At surgeon's request and post-op pain management  Laterality: Right and Upper  Prep: alcohol swabs       Needles:  Injection technique: Single-shot  Needle Type: Stimiplex     Needle Length: 10cm  Needle Gauge: 20     Additional Needles:   Procedures:, nerve stimulator,,, ultrasound used (permanent image in chart),,     Nerve Stimulator or Paresthesia:  Response: biceps flexion  Additional Responses:   Narrative:  End time: 02/12/2024 12:43 PM Injection made incrementally with aspirations every 5 mL.  Performed by: Personally   Additional Notes: Functioning IV was confirmed and monitors were applied.  . Sterile prep and drape,hand hygiene and sterile gloves were used.  Negative aspiration and negative test dose prior to incremental administration of local anesthetic. Some pressure with injection of medicine but no needle paresthesia.  Spontaneous resolution of pressure after injection. The patient tolerated the procedure well.

## 2024-02-12 NOTE — Op Note (Signed)
 02/12/2024  3:45 PM  Patient:   Nathan Shelton  Pre-Op Diagnosis:   Impingement/tendinopathy with possible rotator cuff tear, biceps tendinopathy, and degenerative joint disease of AC joint, right shoulder.  Post-Op Diagnosis:   Impingement/tendinopathy with rotator cuff tear, degenerative joint disease of AC joint, degenerative labral fraying, and biceps tendinopathy, right shoulder.  Procedure:   Extensive arthroscopic debridement, arthroscopic repair of partial-thickness subscapularis tendon tear, arthroscopic subacromial decompression, arthroscopic distal clavicle excision, mini-open rotator repair of supraspinatus tendon tear, and mini-open biceps tenodesis, right shoulder.  Anesthesia:   General endotracheal with interscalene block using Exparel placed preoperatively by the anesthesiologist.  Surgeon:   DOROTHA Reyes Maltos, MD  Assistant:   Mary Beth Sillman, RNFA  Findings:   As above. There was a full-thickness tear involving the mid and posterior portions of the supraspinatus tendon, as well as an articular sided partial-thickness tear involving the superior insertional fibers of the subscapularis tendon. The remaining portions of the rotator cuff were in satisfactory condition. There were areas of significant fraying/tearing of the superior and anterior portions of the labrum without frank detachment from the glenoid rim. The articular surfaces of the glenoid and humerus both were in satisfactory condition. There was moderate scuffing and lip sticking of the biceps tendon.  Complications:   None  Estimated blood loss:   20 cc  Fluids:   500 cc  Tourniquet time:   None  Drains:   None  Closure:   Staples      Brief clinical note:   The patient is a 53 year old male with a history of progressively worsening right shoulder pain and weakness. The patient's symptoms have progressed despite medications, activity modification, etc. The patient's history and examination are  consistent with impingement/tendinopathy with a probable rotator cuff tear. The preoperative MRI scan was inconclusive for a full-thickness rotator cuff tear, but did demonstrate evidence of biceps tendinopathy and degenerative joint disease of the AC joint. The patient presents at this time for definitive management of these shoulder symptoms.  Procedure:   The patient underwent placement of an interscalene block using Exparel by the anesthesiologist in the preoperative holding area before being brought into the operating room and lain in the supine position. The patient then underwent general endotracheal intubation and anesthesia before being repositioned in the beach chair position using the beach chair positioner. The right shoulder and upper extremity were prepped with ChloraPrep solution before being draped sterilely. Preoperative antibiotics were administered. A timeout was performed to confirm the proper surgical site before the expected portal sites and incision site were injected with 0.5% Sensorcaine with epinephrine.   A posterior portal was created and the glenohumeral joint thoroughly inspected with the findings as described above. An anterior portal was created using an outside-in technique. The labrum and rotator cuff were further probed, again confirming the above-noted findings. The areas of labral fraying were debrided back to stable margins using the full-radius resector, as were the torn portions of the supraspinatus and subscapularis tendons. In addition, areas of synovitis anteriorly and superiorly were debrided back to stable margins using the full-radius resector. The ArthroCare wand was inserted and used to release the biceps tendon from his labral anchor but also was used to obtain hemostasis as well as to anneal the labrum superiorly and anteriorly.   The partial-thickness subscapularis tendon tear was addressed first. A separate superolateral portals site was created using an  outside in technique to serve as a working portal. The area of exposed lesser tuberosity  was roughened with a full-radius resector before the subscapularis tendon tear was repaired using a single [employer] 1.8 mm Q-Fix knotless anchor. The adequacy of repair was verified both by probing as well as with gentle passive external rotation of the shoulder and found to be excellent. The instruments were removed from the joint after suctioning the excess fluid.  The camera was repositioned through the posterior portal into the subacromial space. A separate lateral portal was created using an outside-in technique. The 3.5 mm full-radius resector was introduced and used to perform a subtotal bursectomy. The ArthroCare wand was then inserted and used to remove the periosteal tissue off the undersurface of the anterior third of the acromion as well as to recess the coracoacromial ligament from its attachment along the anterior and lateral margins of the acromion. The 4.0 mm acromionizing bur was introduced and used to complete the decompression by removing the undersurface of the anterior third of the acromion. The full radius resector was reintroduced to remove any residual bony debris before the ArthroCare wand was reintroduced to obtain hemostasis.   Attention was then directed to the [employer] joint. The inferior capsule was noted to be attenuated with areas of synovitis extruding from the joint. The ArthroCare wand was used to denude the undersurface of the distal clavicle as well as its anterior and posterior surfaces. The under surface of the distal clavicle was removed using the 4 mm acromionizer bur through the lateral portal. The camera was then repositioned through the lateral portal and the acromionizer bur introduced through the anterior portal to complete the excision of the lateralmost 6 to 8 mm of the distal clavicle. The adequacy of distal clavicle excision was verified both from the lateral and anterior  portals and found to be excellent. The instruments were then removed from the subacromial space after suctioning the excess fluid.  An approximately 4-5 cm incision was made over the anterolateral aspect of the shoulder beginning at the anterolateral corner of the acromion and extending distally in line with the bicipital groove. This incision was carried down through the subcutaneous tissues to expose the deltoid fascia. The raphae between the anterior and middle thirds was identified and this plane developed to provide access into the subacromial space. Additional bursal tissues were debrided sharply using Metzenbaum scissors. The rotator cuff tear was readily identified.   The bicipital groove was identified by palpation and opened for 1-1.5 cm. The biceps tendon stump was retrieved through this defect. The floor of the bicipital groove was roughened with a curet before two Arthrex 2.6 mm double loaded knotless knee fiber tack anchors were placed. The biceps tendon was passed from distal to proximal through both sets of loops and held with some tension. Each loop was then tightened securely to effect the tenodesis. One limb of the more proximal anchor was passed through the tendon to serve as a ripstop using a free needle.  The margins of the rotator cuff tear were debrided sharply with a #15 blade and the exposed greater tuberosity roughened with a rongeur. The tear was repaired using two Smith [employer] Nephew 2.8 mm Q-Fix anchors. These sutures were then brought back laterally and secured using two Smith & Nephew Healicoil knotless RegeneSorb anchors to create a two-layer closure. An apparent watertight closure was obtained.  The wound was copiously irrigated with sterile saline solution before the deltoid raphae was reapproximated using 2-0 Vicryl interrupted sutures. The subcutaneous tissues were closed in two layers using 2-0 Vicryl interrupted  sutures before the skin was closed using staples. The portal  sites also were closed using staples. A sterile bulky dressing was applied to the shoulder before the arm was placed into a shoulder immobilizer. The patient was then awakened, extubated, and returned to the recovery room in satisfactory condition after tolerating the procedure well.

## 2024-02-12 NOTE — Anesthesia Postprocedure Evaluation (Signed)
 Anesthesia Post Note  Patient: Nathan Shelton  Procedure(s) Performed: ARTHROSCOPY, SHOULDER WITH DEBRIDEMENT (Right: Shoulder) DECOMPRESSION, SUBACROMIAL SPACE (Right: Shoulder) EXCISION, CLAVICLE, DISTAL, OPEN (Right: Shoulder) TENODESIS, BICEPS (Right: Shoulder)  Patient location during evaluation: PACU Anesthesia Type: General Level of consciousness: awake and alert Pain management: pain level controlled Vital Signs Assessment: post-procedure vital signs reviewed and stable Respiratory status: spontaneous breathing, nonlabored ventilation and respiratory function stable Cardiovascular status: blood pressure returned to baseline and stable Postop Assessment: no apparent nausea or vomiting Anesthetic complications: no   No notable events documented.   Last Vitals:  Vitals:   02/12/24 1800 02/12/24 1811  BP: 109/77 93/64  Pulse: 70 67  Resp: (!) 21 14  Temp: (!) 36.2 C (!) 36.1 C  SpO2: 95% 93%    Last Pain:  Vitals:   02/12/24 1811  TempSrc: Temporal  PainSc: 0-No pain                 Camellia Merilee Louder

## 2024-02-12 NOTE — H&P (Signed)
 History of Present Illness:  Nathan Shelton is a 53 y.o. male who presents for follow-up of his right shoulder pain secondary to impingement/tendinopathy with a possible rotator cuff tear. The patient notes little change in his symptoms since his last visit 3 weeks ago. He continues to complain of mild to moderate pain in the shoulder which he rates at 2/10 on today's visit, and for which he has been taking Tylenol as necessary with limited benefit. His symptoms are worse with activities at or above shoulder level, as well as when reaching behind his back. He also has some difficulty sleeping on his right side at night. He denies any reinjury to the shoulder since his last visit, and denies any numbness or paresthesias down his arm to his hand. Since his last visit, he has undergone an MRI scan of the right shoulder and presents today to review these results.  Current Outpatient Medications:  acetaminophen (TYLENOL) 325 MG tablet Take 650 mg by mouth every 4 (four) hours as needed for Pain  calcium carbonate (TUMS E-X) 300 mg (750 mg) chewable tablet Take 300-600 mg of elemental by mouth every 2 (two) hours as needed for Heartburn  tadalafiL (CIALIS) 20 MG tablet TAKE 1 TABLET (20 MG TOTAL) BY MOUTH ONCE DAILY AS NEEDED FOR ERECTILE DYSFUNCTION FOR UP TO 30 DAYS 6 tablet 3   Allergies:  Nsaids (Relatively contraindicated by mild renal insufficiency)   Past Medical History:  Ankle fracture, left 1988  Ankle impingement syndrome, right (evaluated by Dr. Ashley)  Bursitis of shoulder, right 03/06/2017 (better with injection by Krystal Doyne)  Chronic renal insufficiency, stage 3 (moderate) (CMS/HHS-HCC) 03/04/2017 (Estimated GFR 57 on labs done by [facility] Screenings in 2018)  COVID-19 virus infection 01/30/2020 (minimal symptoms)  GERD (gastroesophageal reflux disease)  Hyperlipidemia 11/11/2013  Osteoarthritis of both knees  Seborrhea of face; seeing Dr. Arlyss   Past Surgical History:   CIRCUMCISION NEWBORN 1973  COLONOSCOPY 10/14/2021  Screening Colon/10 years/[facility]  APPENDECTOMY around 2004  APPENDECTOMY 2004?  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  VASECTOMY 2013  Wisdom teeth extraction   Family History:  Hyperlipidemia Mother Richardson Simpers  Skin cancer Mother Richardson Simpers  Coronary Artery Disease Father Cali Cuartas  Ulcerative colitis Father Charlie Rummer  Rheum arthritis Father Gor Vestal  Aneurysm Father Rayden Dock  AAA S/P endovascular repair  Thyroid nodules Father Charlie Rummer  Cancer Maternal Grandmother (may have been colon cancer)  Lupus Brother  Raynaud syndrome Brother  No Known Problems Daughter  No Known Problems Daughter  Breast cancer Neg Hx  Prostate cancer Neg Hx   Social History:   Socioeconomic History:  Marital status: Married  Tobacco Use  Smoking status: Former  Current packs/day: 0.00  Average packs/day: 1 pack/day for 5.0 years (5.0 ttl pk-yrs)  Types: Cigarettes  Start date: 09/18/1995  Quit date: 11/24/2000  Years since quitting: 23.1  Smokeless tobacco: Never  Tobacco comments:  History of smoking 1 pack per day for 5 years.  Vaping Use  Vaping status: Never Used  Substance and Sexual Activity  Alcohol use: Not Currently  Comment: rare beer.  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   Social Drivers of Health:   Physicist, medical Strain: Low Risk (06/06/2023)  Overall Financial Resource Strain (CARDIA)  Difficulty of  Paying Living Expenses: Not hard at all  Food Insecurity: No Food Insecurity (06/06/2023)  Hunger Vital Sign  Worried About Running Out of Food in the Last Year: Never true  Ran Out of Food in the Last Year: Never true  Transportation Needs: No Transportation Needs  (06/06/2023)  PRAPARE - Risk analyst (Medical): No  Lack of Transportation (Non-Medical): No   Review of Systems:  A comprehensive 14 point ROS was performed, reviewed, and the pertinent orthopaedic findings are documented in the HPI.  Physical Exam: Vitals:  01/14/24 0854  BP: 118/70  Weight: 93.9 kg (207 lb)  Height: 177.8 cm (5' 10)  PainSc: 2  PainLoc: Shoulder   General/Constitutional: The patient appears to be well-nourished, well-developed, and in no acute distress. Neuro/Psych: Normal mood and affect, oriented to person, place and time. Eyes: Non-icteric. Pupils are equal, round, and reactive to light, and exhibit synchronous movement. ENT: Unremarkable. Lymphatic: No palpable adenopathy. Respiratory: Lungs clear to auscultation, Normal chest excursion, No wheezes, and Non-labored breathing Cardiovascular: Regular rate and rhythm. No murmurs. and No edema, swelling or tenderness, except as noted in detailed exam. Integumentary: No impressive skin lesions present, except as noted in detailed exam. Musculoskeletal: Unremarkable, except as noted in detailed exam.  Right shoulder exam: SKIN: Normal SWELLING: None WARMTH: None LYMPH NODES: No adenopathy palpable CREPITUS: None TENDERNESS: Mild tenderness over AC joint and along bicipital groove anteriorly, minimal tenderness over lateral acromion ROM (active):  Forward flexion: 160 degrees Abduction: 155 degrees Internal rotation: T12 ROM (passive):  Forward flexion: 165 degrees Abduction: 160 degrees ER/IR at 90 abd: 90 degrees/65 degrees  He describes mild pain with forward flexion, abduction, and external rotation at 90 degrees of abduction.  STRENGTH: Forward flexion: 4+/5 Abduction: 4-4+/5 External rotation: 4+/5 Internal rotation: 4+-5/5 Pain with RC testing: Mild pain with resisted abduction  STABILITY: Normal  SPECIAL TESTS: Vonzell' test: Mildly positive Speed's test:  Negative Capsulitis - pain w/ passive ER: No Crossed arm test: Negative Crank: Not evaluated Anterior apprehension: Negative Posterior apprehension: Not evaluated  He remains neurovascularly intact to the right upper extremity.  Imaging:  A recent MRI scan of the right shoulder is available for review and has been reviewed by myself. By report, the study demonstrates evidence of insertional tendinosis of the supraspinatus, infraspinatus, subscapularis tendons with mild intrasubstance partial tears. No full-thickness or retracted tear is present. There also is evidence of mild tendinosis of the intra-articular segment of (the) biceps tendon ... without full-thickness tear. Mild degenerative changes of the [facility] joint more so than the glenohumeral joint also are noted. Both the films and report were reviewed by myself and discussed with the patient.  Assessment: 1. Rotator cuff tendinitis, right.  2. Tendinitis of upper biceps tendon of right shoulder.   Plan: The treatment options were discussed with the patient. In addition, patient educational materials were provided regarding the diagnosis and treatment options. The patient is quite frustrated by his symptoms and function limitations, and would like to consider more aggressive treatment options. Therefore, I have recommended a surgical procedure, specifically a right shoulder arthroscopy with debridement, decompression, distal clavicle excision, and biceps tenodesis. The procedure was discussed with the patient, as were the potential risks (including bleeding, infection, nerve and/or blood vessel injury, persistent or recurrent pain, failure of the repair, progression of arthritis, need for further surgery, blood clots, strokes, heart attacks and/or arhythmias, pneumonia, etc.) and benefits. The patient states his understanding and wishes to proceed. All of the  patient's questions and concerns were answered. He can call any time with further  concerns. He will follow up post-surgery, routine.    H&P reviewed and patient re-examined. No changes.

## 2024-02-12 NOTE — Anesthesia Procedure Notes (Signed)
 Procedure Name: Intubation Date/Time: 02/12/2024 1:35 PM  Performed by: Elly Pfeiffer, CRNAPre-anesthesia Checklist: Patient identified, Emergency Drugs available, Suction available and Patient being monitored Patient Re-evaluated:Patient Re-evaluated prior to induction Oxygen Delivery Method: Circle system utilized Preoxygenation: Pre-oxygenation with 100% oxygen Induction Type: IV induction Ventilation: Mask ventilation without difficulty Laryngoscope Size: Mac and 4 Grade View: Grade II Tube type: Oral Tube size: 7.5 mm Number of attempts: 1 Airway Equipment and Method: Stylet and Oral airway Placement Confirmation: ETT inserted through vocal cords under direct vision, positive ETCO2 and breath sounds checked- equal and bilateral Secured at: 23 cm Tube secured with: Tape Dental Injury: Teeth and Oropharynx as per pre-operative assessment  Comments: Cords clear; no trauma. CA

## 2024-02-12 NOTE — Anesthesia Preprocedure Evaluation (Addendum)
 Anesthesia Evaluation  Patient identified by MRN, date of birth, ID band Patient awake    Reviewed: Allergy & Precautions, H&P , NPO status , Patient's Chart, lab work & pertinent test results, reviewed documented beta blocker date and time   Airway Mallampati: II  TM Distance: >3 FB Neck ROM: full    Dental  (+) Teeth Intact   Pulmonary former smoker   Pulmonary exam normal        Cardiovascular Exercise Tolerance: Good negative cardio ROS Normal cardiovascular exam Rhythm:regular Rate:Normal     Neuro/Psych negative neurological ROS  negative psych ROS   GI/Hepatic Neg liver ROS,GERD  Medicated,,  Endo/Other  negative endocrine ROS    Renal/GU negative Renal ROS  negative genitourinary   Musculoskeletal  (+) Arthritis ,    Abdominal   Peds  Hematology negative hematology ROS (+)   Anesthesia Other Findings Past Medical History: No date: GERD (gastroesophageal reflux disease) No date: Hyperlipemia No date: Osteoarthritis     Comment:  bilateral knee Past Surgical History: No date: APPENDECTOMY No date: MULTIPLE TOOTH EXTRACTIONS No date: VASECTOMY   Reproductive/Obstetrics negative OB ROS                              Anesthesia Physical Anesthesia Plan  ASA: 2  Anesthesia Plan: General ETT   Post-op Pain Management: Regional block*   Induction:   PONV Risk Score and Plan: 3  Airway Management Planned:   Additional Equipment:   Intra-op Plan:   Post-operative Plan:   Informed Consent: I have reviewed the patients History and Physical, chart, labs and discussed the procedure including the risks, benefits and alternatives for the proposed anesthesia with the patient or authorized representative who has indicated his/her understanding and acceptance.     Dental Advisory Given  Plan Discussed with: CRNA  Anesthesia Plan Comments:          Anesthesia Quick  Evaluation

## 2024-02-12 NOTE — Discharge Instructions (Addendum)
 Orthopedic discharge instructions: Keep dressing dry and intact.  May shower after dressing changed on post-op day #4 (Saturday).  Cover staples with Band-Aids after drying off. Apply ice frequently to shoulder. Take oxycodone as prescribed when needed.  May supplement with ES Tylenol if necessary. Keep shoulder immobilizer on at all times except may remove for bathing purposes. Follow-up in 10-14 days or as scheduled.  SHOULDER SLING IMMOBILIZER   VIDEO Slingshot 2 Shoulder Brace Application - YouTube ---https://www.porter.info/  INSTRUCTIONS While supporting the injured arm, slide the forearm into the sling. Wrap the adjustable shoulder strap around the neck and shoulders and attach the strap end to the sling using  the "alligator strap tab."  Adjust the shoulder strap to the required length. Position the shoulder pad behind the neck. To secure the shoulder pad location (optional), pull the shoulder strap away from the shoulder pad, unfold the hook material on the top of the pad, then press the shoulder strap back onto the hook material to secure the pad in place. Attach the closure strap across the open top of the sling. Position the strap so that it holds the arm securely in the sling. Next, attach the thumb strap to the open end of the sling between the thumb and fingers. After sling has been fit, it may be easily removed and reapplied using the quick release buckle on shoulder strap. If a neutral pillow or 15 abduction pillow is included, place the pillow at the waistline. Attach the sling to the pillow, lining up hook material on the pillow with the loop on sling. Adjust the waist strap to fit.  If waist strap is too long, cut it to fit. Use the small piece of double sided hook material (located on top of the pillow) to secure the strap end. Place the double sided hook material on the inside of the cut strap end and secure it to the waist strap.     If no pillow is  included, attach the waist strap to the sling and adjust to fit.    Washing Instructions: Straps and sling must be removed and cleaned regularly depending on your activity level and perspiration. Hand wash straps and sling in cold water with mild detergent, rinse, air dry     Interscalene Nerve Block (ISNB) Discharge Instructions    For your surgery you have received an Interscalene Nerve Block. Nerve Blocks affect many types of nerves, including nerves that control movement, pain and normal sensation.  You may experience feelings such as numbness, tingling, heaviness, weakness or the inability to move your arm or the feeling or sensation that your arm has fallen asleep. A nerve block can last for 2 - 36 hours or more depending on the medication used.  Usually the weakness wears off first.  The tingling and heaviness usually wear off next.  Finally you may start to notice pain.  Keep in mind that this may occur in any order.  once a nerve block starts to wear off it is usually completely gone within 60 minutes. ISNB may cause mild shortness of breath, a hoarse voice, blurry vision, unequal pupils, or drooping of the face on the same side as the nerve block.  These symptoms will usually go away within 12 hours.  Very rarely the procedure itself can cause mild seizures. If needed, your surgeon will give you a prescription for pain medication.  It will take about 60 minutes for the oral pain medication to become fully effective.  So, it  is recommended that you start taking this medication before the nerve block first begins to wear off, or when you first begin to feel discomfort. Keep in mind that nerve blocks often wear off in the middle of the night.   If you are going to bed and the block has not started to wear off or you have not started to have any discomfort, consider setting an alarm for 2 to 3 hours, so you can assess your block.  If you notice the block is wearing off or you are starting to  have discomfort, you can take your pain medication. Take your pain medication only as prescribed.  Pain medication can cause sedation and decrease your breathing if you take more than you need for the level of pain that you have. Nausea is a common side effect of many pain medications.  You may want to eat something before taking your pain medicine to prevent nausea. After an Interscalene nerve block, you cannot feel pain, pressure or extremes in temperature in the effected arm.  Because your arm is numb it is at an increased risk for injury.  To decrease the possibility of injury, please practice the following:  While you are awake change the position of your arm frequently to prevent too much pressure on any one area for prolonged periods of time.  If you have a cast or tight dressing, check the color or your fingers every couple of hours.  Call your surgeon with the appearance of any discoloration (white or blue). If you are given a sling to wear before you go home, please wear it  at all times until the block has completely worn off.  Do not get up at night without your sling. If you experience any problems or concerns, please contact your surgeon's office. If you experience severe or prolonged shortness of breath go to the nearest emergency department.

## 2024-02-13 ENCOUNTER — Encounter: Payer: Self-pay | Admitting: Surgery

## 2024-05-06 ENCOUNTER — Encounter: Payer: Self-pay | Admitting: Surgery
